# Patient Record
Sex: Female | Born: 1935
Health system: Southern US, Community
[De-identification: ages and names within clinical notes are randomized; demographics above are authoritative.]

---

## 2004-03-19 ENCOUNTER — Ambulatory Visit: Payer: Self-pay | Admitting: Unknown Physician Specialty

## 2004-05-20 ENCOUNTER — Inpatient Hospital Stay: Payer: Self-pay | Admitting: Specialist

## 2004-05-24 ENCOUNTER — Encounter: Payer: Self-pay | Admitting: Internal Medicine

## 2004-06-18 ENCOUNTER — Encounter: Payer: Self-pay | Admitting: Internal Medicine

## 2004-08-30 ENCOUNTER — Ambulatory Visit: Payer: Self-pay | Admitting: Unknown Physician Specialty

## 2004-09-06 ENCOUNTER — Ambulatory Visit: Payer: Self-pay | Admitting: Unknown Physician Specialty

## 2005-03-20 ENCOUNTER — Ambulatory Visit: Payer: Self-pay | Admitting: Unknown Physician Specialty

## 2005-03-26 ENCOUNTER — Ambulatory Visit: Payer: Self-pay | Admitting: Unknown Physician Specialty

## 2005-11-06 ENCOUNTER — Ambulatory Visit: Payer: Self-pay | Admitting: Unknown Physician Specialty

## 2006-05-21 ENCOUNTER — Ambulatory Visit: Payer: Self-pay | Admitting: Unknown Physician Specialty

## 2006-05-26 ENCOUNTER — Other Ambulatory Visit: Payer: Self-pay

## 2006-05-26 ENCOUNTER — Inpatient Hospital Stay: Payer: Self-pay | Admitting: Internal Medicine

## 2006-07-29 ENCOUNTER — Ambulatory Visit: Payer: Self-pay | Admitting: Internal Medicine

## 2006-07-31 ENCOUNTER — Ambulatory Visit: Payer: Self-pay | Admitting: Unknown Physician Specialty

## 2007-01-03 ENCOUNTER — Inpatient Hospital Stay: Payer: Self-pay | Admitting: Internal Medicine

## 2007-02-05 ENCOUNTER — Ambulatory Visit: Payer: Self-pay | Admitting: Unknown Physician Specialty

## 2008-03-31 IMAGING — CR DG ABDOMEN 2V
1 series · 2 of 2 positions shown · non-contrast
Comparison: none

REASON FOR EXAM: vomiting
COMMENTS:

[Series 1: view not recorded · 0.17mm/px · 2 of 2 slices shown]
[im 1/2]
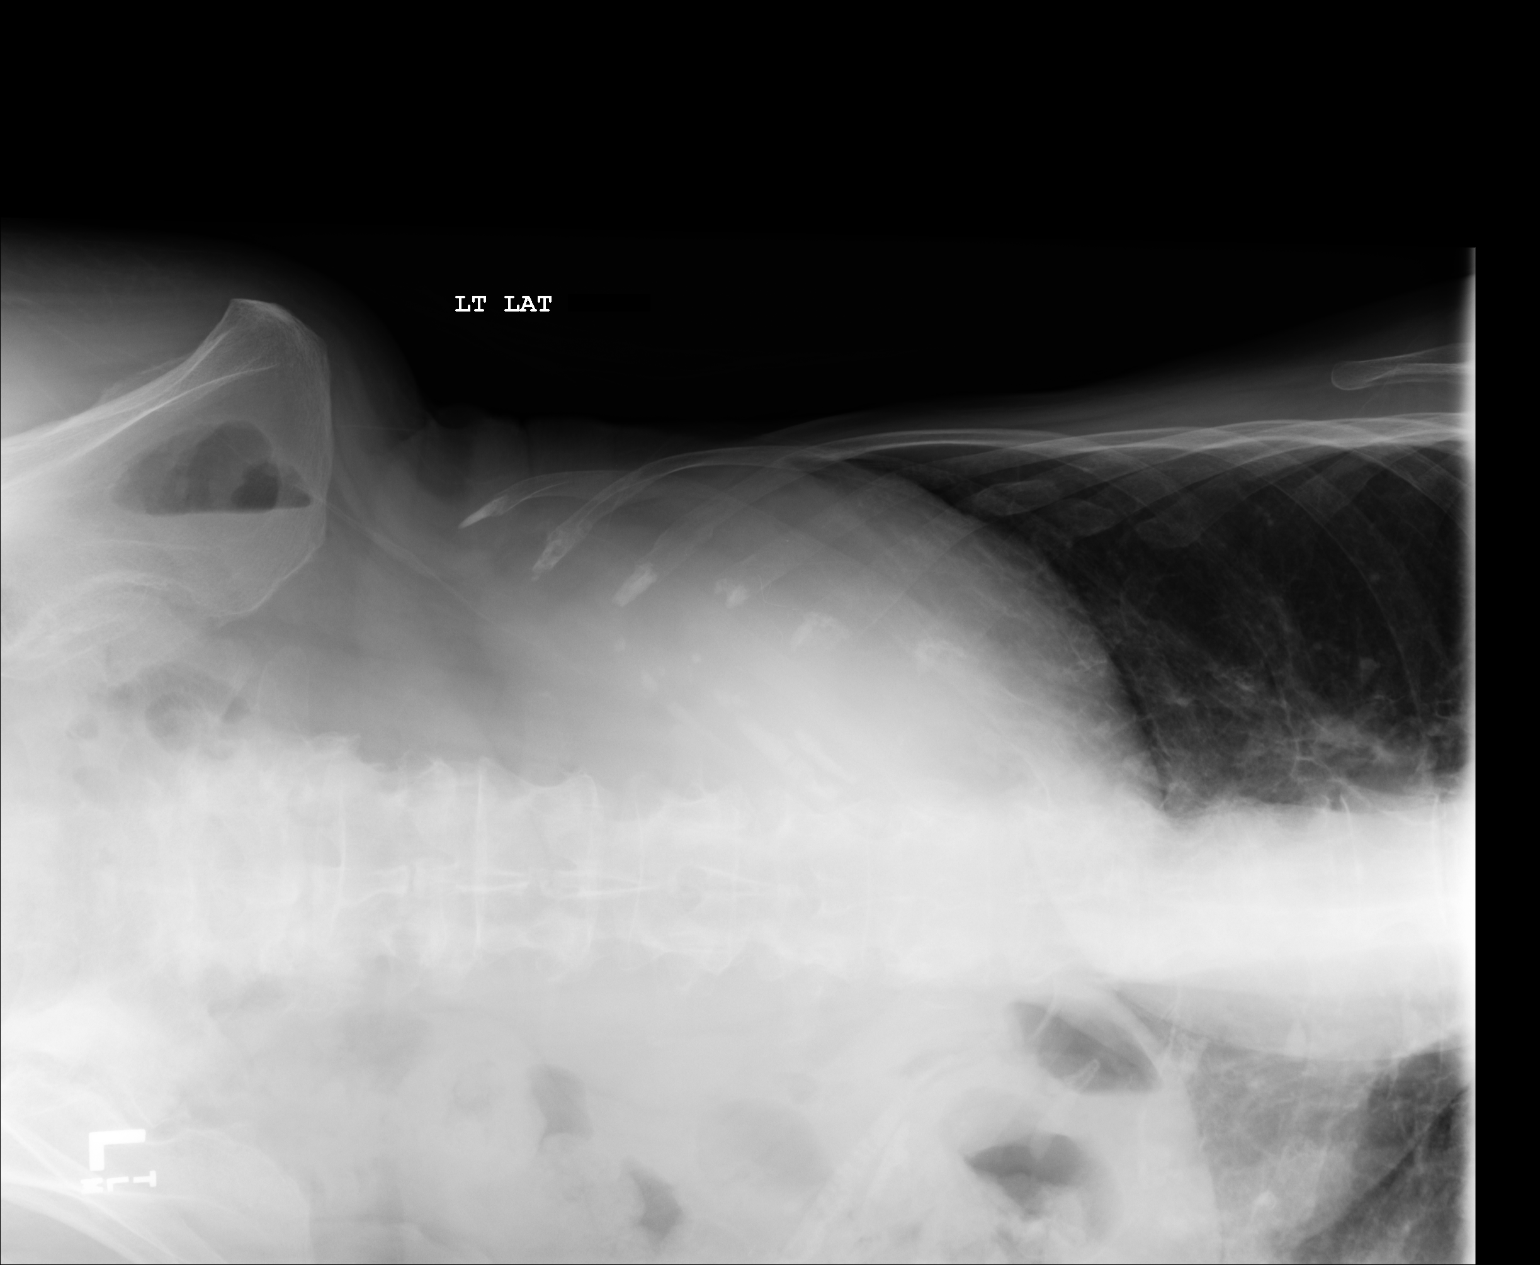
[im 2/2]
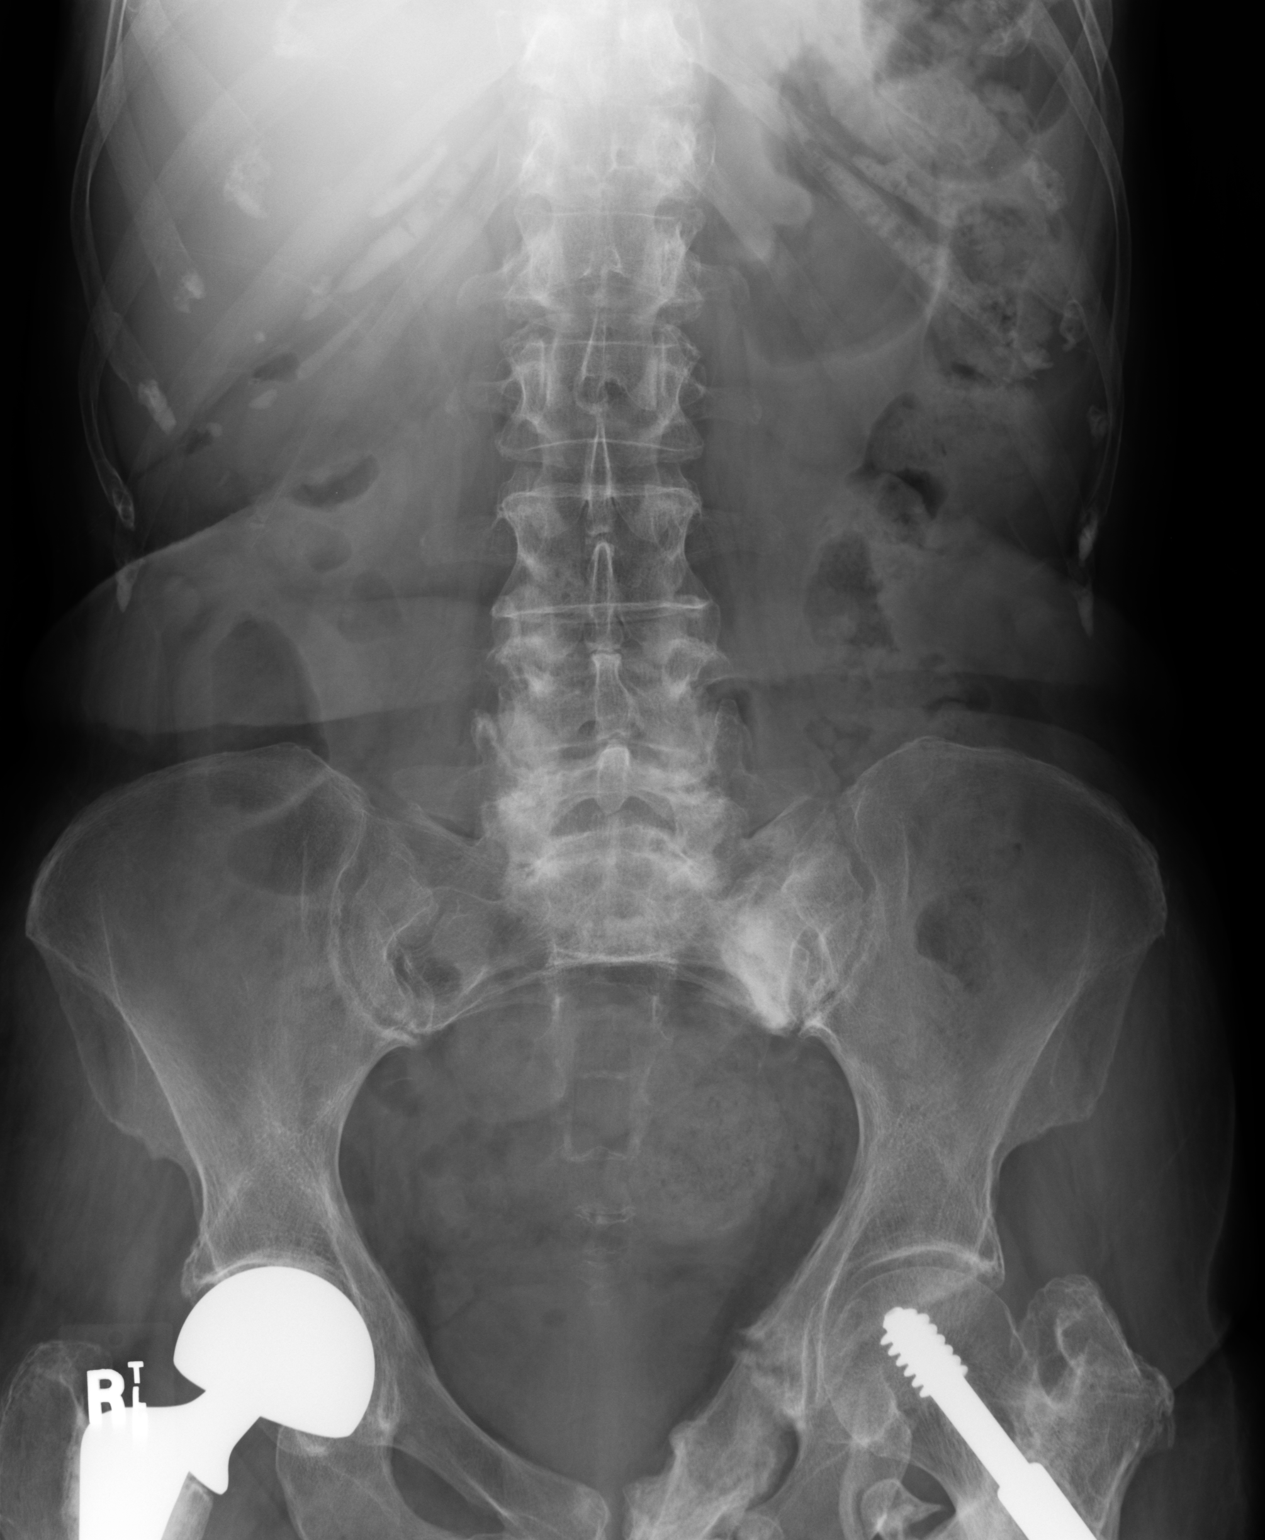

[2 of 2 positions shown; findings below may reference images not displayed]

PROCEDURE:     DXR - DXR ABDOMEN 2 V FLAT AND ERECT  - January 02, 2007 [DATE]

RESULT:     Supine and LEFT lateral decubitus images of the abdomen show
postoperative changes in both hips with sclerosis in the LEFT sacrum. There
is air and stool scattered through the colon to the sigmoid region. There is
air in the stomach. There is no abnormal bowel distention or free air.
IMPRESSION: 1.     No evidence of bowel obstruction. Incidental note is made of
sclerosis in the LEFT sacrum and healing fracture in the LEFT pubic region.
The possibility of a LEFT sacral fracture should be correlated clinically.

## 2008-06-02 ENCOUNTER — Ambulatory Visit: Payer: Self-pay | Admitting: Unknown Physician Specialty

## 2008-08-21 ENCOUNTER — Ambulatory Visit: Payer: Self-pay | Admitting: Internal Medicine

## 2008-08-23 ENCOUNTER — Ambulatory Visit: Payer: Self-pay | Admitting: Internal Medicine

## 2008-09-08 ENCOUNTER — Ambulatory Visit: Payer: Self-pay

## 2009-06-08 ENCOUNTER — Ambulatory Visit: Payer: Self-pay | Admitting: Unknown Physician Specialty

## 2009-09-01 ENCOUNTER — Inpatient Hospital Stay: Payer: Self-pay | Admitting: Internal Medicine

## 2009-10-18 ENCOUNTER — Ambulatory Visit: Payer: Self-pay | Admitting: Dermatology

## 2010-03-05 ENCOUNTER — Ambulatory Visit: Payer: Self-pay | Admitting: Unknown Physician Specialty

## 2010-03-20 ENCOUNTER — Ambulatory Visit: Payer: Self-pay | Admitting: Pain Medicine

## 2010-04-02 ENCOUNTER — Ambulatory Visit: Payer: Self-pay | Admitting: Pain Medicine

## 2010-04-12 ENCOUNTER — Ambulatory Visit: Payer: Self-pay | Admitting: Pain Medicine

## 2010-05-01 ENCOUNTER — Ambulatory Visit: Payer: Self-pay | Admitting: Pain Medicine

## 2010-07-26 ENCOUNTER — Ambulatory Visit: Payer: Self-pay | Admitting: Pain Medicine

## 2010-08-10 ENCOUNTER — Emergency Department: Payer: Self-pay | Admitting: Emergency Medicine

## 2010-08-13 ENCOUNTER — Ambulatory Visit: Payer: Self-pay | Admitting: Pain Medicine

## 2010-08-18 ENCOUNTER — Inpatient Hospital Stay: Payer: Self-pay | Admitting: Orthopedic Surgery

## 2010-08-21 DIAGNOSIS — R079 Chest pain, unspecified: Secondary | ICD-10-CM

## 2010-08-28 LAB — PATHOLOGY REPORT

## 2010-09-05 ENCOUNTER — Emergency Department: Payer: Self-pay | Admitting: *Deleted

## 2010-10-08 ENCOUNTER — Inpatient Hospital Stay: Payer: Self-pay | Admitting: Internal Medicine

## 2010-10-28 ENCOUNTER — Emergency Department: Payer: Self-pay | Admitting: Emergency Medicine

## 2010-11-18 ENCOUNTER — Emergency Department: Payer: Self-pay | Admitting: *Deleted

## 2010-12-02 ENCOUNTER — Emergency Department: Payer: Self-pay | Admitting: Emergency Medicine

## 2012-02-19 ENCOUNTER — Ambulatory Visit: Payer: Self-pay | Admitting: Internal Medicine

## 2012-02-22 ENCOUNTER — Emergency Department: Payer: Self-pay | Admitting: Internal Medicine

## 2012-03-05 ENCOUNTER — Inpatient Hospital Stay: Payer: Self-pay | Admitting: Internal Medicine

## 2012-03-05 LAB — COMPREHENSIVE METABOLIC PANEL
Albumin: 3.1 g/dL — ABNORMAL LOW (ref 3.4–5.0)
Bilirubin,Total: 0.2 mg/dL (ref 0.2–1.0)
Co2: 21 mmol/L (ref 21–32)
EGFR (African American): 14 — ABNORMAL LOW
Glucose: 101 mg/dL — ABNORMAL HIGH (ref 65–99)
Osmolality: 298 (ref 275–301)
Potassium: 5.2 mmol/L — ABNORMAL HIGH (ref 3.5–5.1)
SGOT(AST): 22 U/L (ref 15–37)
SGPT (ALT): 25 U/L (ref 12–78)
Sodium: 139 mmol/L (ref 136–145)
Total Protein: 6.3 g/dL — ABNORMAL LOW (ref 6.4–8.2)

## 2012-03-05 LAB — CBC
HCT: 23.8 % — ABNORMAL LOW (ref 35.0–47.0)
HGB: 7.4 g/dL — ABNORMAL LOW (ref 12.0–16.0)
MCV: 79 fL — ABNORMAL LOW (ref 80–100)
Platelet: 328 10*3/uL (ref 150–440)

## 2012-03-05 LAB — CK TOTAL AND CKMB (NOT AT ARMC)
CK, Total: 109 U/L (ref 21–215)
CK-MB: 3.6 ng/mL (ref 0.5–3.6)

## 2012-03-05 LAB — OCCULT BLOOD X 1 CARD TO LAB, STOOL: Occult Blood, Feces: NEGATIVE

## 2012-03-05 LAB — PRO B NATRIURETIC PEPTIDE: B-Type Natriuretic Peptide: 3392 pg/mL — ABNORMAL HIGH (ref 0–450)

## 2012-03-05 LAB — TROPONIN I: Troponin-I: 0.03 ng/mL

## 2012-03-05 LAB — IRON AND TIBC: Iron Bind.Cap.(Total): 378 ug/dL (ref 250–450)

## 2012-03-05 LAB — RAPID INFLUENZA A&B ANTIGENS

## 2012-03-06 LAB — BASIC METABOLIC PANEL
Anion Gap: 9 (ref 7–16)
Chloride: 108 mmol/L — ABNORMAL HIGH (ref 98–107)
Co2: 23 mmol/L (ref 21–32)
EGFR (African American): 14 — ABNORMAL LOW

## 2012-03-06 LAB — CBC WITH DIFFERENTIAL/PLATELET
Basophil #: 0.1 10*3/uL (ref 0.0–0.1)
Eosinophil #: 0.4 10*3/uL (ref 0.0–0.7)
Eosinophil %: 3.6 %
HCT: 27.5 % — ABNORMAL LOW (ref 35.0–47.0)
HGB: 9.2 g/dL — ABNORMAL LOW (ref 12.0–16.0)
Lymphocyte #: 2.2 10*3/uL (ref 1.0–3.6)
MCH: 27.1 pg (ref 26.0–34.0)
MCHC: 33.5 g/dL (ref 32.0–36.0)
Monocyte #: 1.1 x10 3/mm — ABNORMAL HIGH (ref 0.2–0.9)
Monocyte %: 9.7 %
Neutrophil #: 7.2 10*3/uL — ABNORMAL HIGH (ref 1.4–6.5)
Neutrophil %: 66 %
Platelet: 305 10*3/uL (ref 150–440)
RBC: 3.39 10*6/uL — ABNORMAL LOW (ref 3.80–5.20)
RDW: 17.1 % — ABNORMAL HIGH (ref 11.5–14.5)
WBC: 10.9 10*3/uL (ref 3.6–11.0)

## 2012-03-07 LAB — BASIC METABOLIC PANEL
Anion Gap: 8 (ref 7–16)
Calcium, Total: 8.2 mg/dL — ABNORMAL LOW (ref 8.5–10.1)
Chloride: 108 mmol/L — ABNORMAL HIGH (ref 98–107)
Co2: 24 mmol/L (ref 21–32)
Creatinine: 2.33 mg/dL — ABNORMAL HIGH (ref 0.60–1.30)
EGFR (African American): 21 — ABNORMAL LOW
Potassium: 5 mmol/L (ref 3.5–5.1)
Sodium: 140 mmol/L (ref 136–145)

## 2012-03-08 ENCOUNTER — Inpatient Hospital Stay: Payer: Self-pay | Admitting: Internal Medicine

## 2012-03-08 LAB — COMPREHENSIVE METABOLIC PANEL
Albumin: 3.2 g/dL — ABNORMAL LOW (ref 3.4–5.0)
Alkaline Phosphatase: 115 U/L (ref 50–136)
Anion Gap: 12 (ref 7–16)
Bilirubin,Total: 0.3 mg/dL (ref 0.2–1.0)
Calcium, Total: 8.4 mg/dL — ABNORMAL LOW (ref 8.5–10.1)
Chloride: 108 mmol/L — ABNORMAL HIGH (ref 98–107)
EGFR (African American): 26 — ABNORMAL LOW
EGFR (Non-African Amer.): 22 — ABNORMAL LOW
Glucose: 122 mg/dL — ABNORMAL HIGH (ref 65–99)
Osmolality: 289 (ref 275–301)
SGOT(AST): 37 U/L (ref 15–37)
SGPT (ALT): 26 U/L (ref 12–78)
Total Protein: 6.7 g/dL (ref 6.4–8.2)

## 2012-03-08 LAB — URINALYSIS, COMPLETE
Bilirubin,UR: NEGATIVE
Glucose,UR: NEGATIVE mg/dL (ref 0–75)
Leukocyte Esterase: NEGATIVE
Nitrite: NEGATIVE
Ph: 5 (ref 4.5–8.0)
Protein: 100
RBC,UR: 5 /HPF (ref 0–5)
Specific Gravity: 1.018 (ref 1.003–1.030)
Squamous Epithelial: 11
WBC UR: 6 /HPF (ref 0–5)

## 2012-03-08 LAB — CBC
HCT: 32.7 % — ABNORMAL LOW (ref 35.0–47.0)
HGB: 10.2 g/dL — ABNORMAL LOW (ref 12.0–16.0)
MCH: 25.5 pg — ABNORMAL LOW (ref 26.0–34.0)
MCHC: 31.2 g/dL — ABNORMAL LOW (ref 32.0–36.0)
Platelet: 335 10*3/uL (ref 150–440)
RBC: 4 10*6/uL (ref 3.80–5.20)
WBC: 25.3 10*3/uL — ABNORMAL HIGH (ref 3.6–11.0)

## 2012-03-08 LAB — PROTIME-INR
INR: 1.1
Prothrombin Time: 14.1 secs (ref 11.5–14.7)

## 2012-03-08 LAB — APTT: Activated PTT: 31.9 secs (ref 23.6–35.9)

## 2012-03-09 LAB — BASIC METABOLIC PANEL
Anion Gap: 8 (ref 7–16)
Calcium, Total: 7.8 mg/dL — ABNORMAL LOW (ref 8.5–10.1)
Co2: 22 mmol/L (ref 21–32)
Glucose: 139 mg/dL — ABNORMAL HIGH (ref 65–99)
Sodium: 140 mmol/L (ref 136–145)

## 2012-03-09 LAB — CBC WITH DIFFERENTIAL/PLATELET
Basophil #: 0.1 10*3/uL (ref 0.0–0.1)
Basophil %: 0.2 %
Eosinophil %: 0.1 %
HGB: 6.3 g/dL — ABNORMAL LOW (ref 12.0–16.0)
Lymphocyte #: 1.9 10*3/uL (ref 1.0–3.6)
Lymphocyte %: 6.9 %
MCH: 26.6 pg (ref 26.0–34.0)
MCHC: 32.3 g/dL (ref 32.0–36.0)
MCV: 82 fL (ref 80–100)
Monocyte #: 3.2 x10 3/mm — ABNORMAL HIGH (ref 0.2–0.9)
Monocyte %: 11.2 %
Neutrophil #: 23 10*3/uL — ABNORMAL HIGH (ref 1.4–6.5)
Platelet: 313 10*3/uL (ref 150–440)

## 2012-03-10 LAB — BASIC METABOLIC PANEL
Anion Gap: 8 (ref 7–16)
BUN: 44 mg/dL — ABNORMAL HIGH (ref 7–18)
Calcium, Total: 7.5 mg/dL — ABNORMAL LOW (ref 8.5–10.1)
Chloride: 112 mmol/L — ABNORMAL HIGH (ref 98–107)
EGFR (Non-African Amer.): 16 — ABNORMAL LOW
Glucose: 131 mg/dL — ABNORMAL HIGH (ref 65–99)

## 2012-03-10 LAB — CULTURE, BLOOD (SINGLE)

## 2012-03-10 LAB — CLOSTRIDIUM DIFFICILE BY PCR

## 2012-03-10 LAB — HEMOGLOBIN: HGB: 6.6 g/dL — ABNORMAL LOW (ref 12.0–16.0)

## 2012-03-10 LAB — OCCULT BLOOD X 1 CARD TO LAB, STOOL: Occult Blood, Feces: POSITIVE

## 2012-03-11 LAB — BASIC METABOLIC PANEL
Anion Gap: 10 (ref 7–16)
BUN: 51 mg/dL — ABNORMAL HIGH (ref 7–18)
Calcium, Total: 7.4 mg/dL — ABNORMAL LOW (ref 8.5–10.1)
Chloride: 113 mmol/L — ABNORMAL HIGH (ref 98–107)
Creatinine: 2.44 mg/dL — ABNORMAL HIGH (ref 0.60–1.30)
EGFR (African American): 20 — ABNORMAL LOW
EGFR (Non-African Amer.): 17 — ABNORMAL LOW
Sodium: 146 mmol/L — ABNORMAL HIGH (ref 136–145)

## 2012-03-11 LAB — CBC WITH DIFFERENTIAL/PLATELET
HCT: 23.6 % — ABNORMAL LOW (ref 35.0–47.0)
HGB: 7.7 g/dL — ABNORMAL LOW (ref 12.0–16.0)
MCH: 27.9 pg (ref 26.0–34.0)
RDW: 17 % — ABNORMAL HIGH (ref 11.5–14.5)
Segmented Neutrophils: 78 %
WBC: 24.4 10*3/uL — ABNORMAL HIGH (ref 3.6–11.0)

## 2012-03-12 LAB — CBC WITH DIFFERENTIAL/PLATELET
Basophil %: 0.5 %
Eosinophil #: 0.1 10*3/uL (ref 0.0–0.7)
Eosinophil %: 0.5 %
HGB: 6.3 g/dL — ABNORMAL LOW (ref 12.0–16.0)
Lymphocyte %: 11.8 %
MCV: 86 fL (ref 80–100)
Monocyte %: 11.4 %
Neutrophil #: 13.3 10*3/uL — ABNORMAL HIGH (ref 1.4–6.5)
Platelet: 225 10*3/uL (ref 150–440)
RBC: 2.6 10*6/uL — ABNORMAL LOW (ref 3.80–5.20)
WBC: 17.6 10*3/uL — ABNORMAL HIGH (ref 3.6–11.0)

## 2012-03-12 LAB — BASIC METABOLIC PANEL
Anion Gap: 9 (ref 7–16)
BUN: 56 mg/dL — ABNORMAL HIGH (ref 7–18)
Calcium, Total: 7.6 mg/dL — ABNORMAL LOW (ref 8.5–10.1)
Chloride: 115 mmol/L — ABNORMAL HIGH (ref 98–107)
Co2: 25 mmol/L (ref 21–32)
Creatinine: 2.32 mg/dL — ABNORMAL HIGH (ref 0.60–1.30)
EGFR (Non-African Amer.): 18 — ABNORMAL LOW
Sodium: 149 mmol/L — ABNORMAL HIGH (ref 136–145)

## 2012-03-13 LAB — CBC WITH DIFFERENTIAL/PLATELET
Basophil #: 0.1 10*3/uL (ref 0.0–0.1)
Basophil %: 0.6 %
Basophil %: 0.3 %
Eosinophil #: 0.3 10*3/uL (ref 0.0–0.7)
Eosinophil #: 0.3 10*3/uL (ref 0.0–0.7)
Eosinophil %: 1.8 %
HCT: 38 % (ref 35.0–47.0)
HGB: 12.2 g/dL (ref 12.0–16.0)
HGB: 12.4 g/dL (ref 12.0–16.0)
Lymphocyte #: 2.2 10*3/uL (ref 1.0–3.6)
Lymphocyte #: 2.7 10*3/uL (ref 1.0–3.6)
Lymphocyte %: 13.3 %
Lymphocyte %: 16.3 %
MCH: 29.6 pg (ref 26.0–34.0)
MCH: 28.4 pg (ref 26.0–34.0)
MCHC: 32.7 g/dL (ref 32.0–36.0)
Monocyte %: 7.9 %
Monocyte %: 7.5 %
Neutrophil #: 12.6 10*3/uL — ABNORMAL HIGH (ref 1.4–6.5)
Neutrophil %: 74.1 %
RBC: 4.13 10*6/uL (ref 3.80–5.20)
RBC: 4.37 10*6/uL (ref 3.80–5.20)
RDW: 16.4 % — ABNORMAL HIGH (ref 11.5–14.5)
RDW: 16.4 % — ABNORMAL HIGH (ref 11.5–14.5)

## 2012-03-13 LAB — BASIC METABOLIC PANEL
Anion Gap: 9 (ref 7–16)
Calcium, Total: 8.3 mg/dL — ABNORMAL LOW (ref 8.5–10.1)
Chloride: 114 mmol/L — ABNORMAL HIGH (ref 98–107)
EGFR (Non-African Amer.): 19 — ABNORMAL LOW
Osmolality: 317 (ref 275–301)
Sodium: 151 mmol/L — ABNORMAL HIGH (ref 136–145)

## 2012-03-14 LAB — BASIC METABOLIC PANEL
BUN: 47 mg/dL — ABNORMAL HIGH (ref 7–18)
Calcium, Total: 7.8 mg/dL — ABNORMAL LOW (ref 8.5–10.1)
Chloride: 118 mmol/L — ABNORMAL HIGH (ref 98–107)
Co2: 28 mmol/L (ref 21–32)
Creatinine: 1.87 mg/dL — ABNORMAL HIGH (ref 0.60–1.30)
EGFR (African American): 28 — ABNORMAL LOW
EGFR (Non-African Amer.): 24 — ABNORMAL LOW

## 2012-03-14 LAB — CBC WITH DIFFERENTIAL/PLATELET
Basophil #: 0.1 10*3/uL (ref 0.0–0.1)
Eosinophil #: 0.4 10*3/uL (ref 0.0–0.7)
Eosinophil %: 2.5 %
HCT: 34.6 % — ABNORMAL LOW (ref 35.0–47.0)
HGB: 11.4 g/dL — ABNORMAL LOW (ref 12.0–16.0)
Lymphocyte #: 2.1 10*3/uL (ref 1.0–3.6)
Lymphocyte %: 12 %
MCH: 29.2 pg (ref 26.0–34.0)
Monocyte #: 1.6 x10 3/mm — ABNORMAL HIGH (ref 0.2–0.9)
Monocyte %: 9.6 %
Neutrophil #: 12.9 10*3/uL — ABNORMAL HIGH (ref 1.4–6.5)
Neutrophil %: 75.3 %
Platelet: 254 10*3/uL (ref 150–440)
RBC: 3.92 10*6/uL (ref 3.80–5.20)
RDW: 16.7 % — ABNORMAL HIGH (ref 11.5–14.5)

## 2012-03-15 LAB — BASIC METABOLIC PANEL
Anion Gap: 8 (ref 7–16)
Calcium, Total: 8 mg/dL — ABNORMAL LOW (ref 8.5–10.1)
Chloride: 119 mmol/L — ABNORMAL HIGH (ref 98–107)
Co2: 26 mmol/L (ref 21–32)
Creatinine: 1.59 mg/dL — ABNORMAL HIGH (ref 0.60–1.30)
EGFR (African American): 33 — ABNORMAL LOW
Glucose: 111 mg/dL — ABNORMAL HIGH (ref 65–99)
Potassium: 3.9 mmol/L (ref 3.5–5.1)
Sodium: 153 mmol/L — ABNORMAL HIGH (ref 136–145)

## 2012-03-21 ENCOUNTER — Ambulatory Visit: Payer: Self-pay | Admitting: Internal Medicine

## 2012-06-18 DEATH — deceased

## 2014-06-10 NOTE — Consult Note (Signed)
Consult dictated, LVEF 50%, severe MR, but asymptomatic, and no EKG changes. Advise metoprolol 50 bid, and proceed with surgery.  Electronic Signatures: Radene KneeKhan, Carloyn Lahue Ali (MD)  (Signed on 19-Jan-14 10:19)  Authored  Last Updated: 19-Jan-14 10:19 by Radene KneeKhan, Tayron Hunnell Ali (MD)

## 2014-06-10 NOTE — Consult Note (Signed)
Brief Consult Note: Diagnosis: Right Femur Fracture, ARF, Pneumonia, Dementia.   Patient was seen by consultant.   Consult note dictated.   Recommend further assessment or treatment.   Orders entered.   Discussed with Attending MD.   Comments: 81 yo female with recent discharge for PNA, ARF, Anemia, readmitted for accidental fall with complete fx of right femur, will need medical management and pre op eval  1) Right Femur Fx - complete fx, pain management per surgery - plan for surgical repair later this morning  2) Preop Cardiac  - Elevated BNP- Echo - EF- 50 % with severe MR- systolic heart failure,  Xray shows congestion.    No lasix due to Renal failure.    Further evaluation by cardiology given severe MR, systolic heart failure, hx of CAD in 2000  3) pneumonia: resolving: cont with Rocephin+ Zithromax    Influenza- negative.    Blood cx negative.  4) ARF- Now improving.   Will give gentle hydration- as have CHF   5) Anemia- Guiac negative (per records from recent admission)   S/P transfusion- improved.  6) Htn- stable without meds.  7) Swallowing diff- SLP eval-Mech Soft diet, however NPO for surgery at this time.  8) Dementia- continue quetiapine, pramipexole and alprazolam. (resume PO meds after surgery)  PMD - Dr. Francia Greavesheryl Jeffries Time spent evaluating patient = 50 minutes Job# [id number].  Electronic Signatures: Stephanie AcreMungal, Anali Cabanilla (MD)  (Signed 19-Jan-14 05:27)  Authored: Brief Consult Note   Last Updated: 19-Jan-14 05:27 by Stephanie AcreMungal, Saajan Willmon (MD)

## 2014-06-10 NOTE — Op Note (Signed)
PATIENT NAME:  Deanna Vargas, Deanna B MR#:  [id number] DATE OF BIRTH:  Apr 28, 1922  DATE OF PROCEDURE:  03/08/2012  PREOPERATIVE DIAGNOSIS: Right hip periprosthetic femur fracture.   POSTOPERATIVE DIAGNOSIS: Right hip periprosthetic femur fracture.   PROCEDURE: Open reduction and internal fixation of right periprosthetic proximal femur fracture.   SURGEON: Juanell FairlyKevin Reis Goga, MD  ANESTHESIA: Spinal.   COMPLICATIONS: None.   SPECIMENS: None.   ESTIMATED BLOOD LOSS: 300 mL.   IMPLANTS:  Synthes femoral locking plate.  INDICATIONS FOR PROCEDURE: The patient is an 81 year old female from [facility] who fell. She was brought to the [facility] where she was diagnosed with a right periprosthetic proximal femur fracture just below a hemiarthroplasty prosthesis of the right hip. By x-ray evaluation, the prosthesis was stable. I had recommended to proceed with an open reduction and internal fixation of the fracture. I discussed the risks and benefits of the procedure with the patient and her family. The patient has mild to moderate dementia and therefore one of her family members, who has power of attorney, signed her consent form. They understand the risks include infection, bleeding requiring blood transfusion, nerve or blood vessel injury leading to permanent numbness or weakness, failure of the hardware, fracture, hip dislocation, leg length discrepancy, lower extremity rotation, persistent right hip pain, failure to return to ambulation and the need for further surgery. Medical risks include DVT and pulmonary embolism, myocardial infarction, stroke, pneumonia, respiratory failure and death. The family agreed to proceed with surgery.   DESCRIPTION OF PROCEDURE: The patient was marked with the word yes over the right thigh within the operative field. History and physical was completed. The patient was then brought to the operating room where she underwent a spinal  anesthetic, by the anesthesia service. The patient was then laid supine, on the operative table. All bony prominences were adequately padded. The patient was then prepped and draped in a sterile fashion. A timeout was performed to verify the patient's name, date of birth, medical record number, correct site of surgery and correct procedure to be performed. It was also used to confirm the patient had received antibiotics and that all appropriate instruments, implants and radiographic studies were available in the room. Once all in attendance were in agreement, the case began.   Initial C-arm images were taken in the AP and lateral plane. This helped to plan for the surgical incision. A lateral incision along the femur was made in line with the femur. The soft tissues were carefully dissected using electrocautery. The fascia lata was then identified and sharply incised as well with a #10 blade. The vastus lateralis was then lifted off the intramuscular septum to reveal the underlying femur. The fracture was easily palpated. The fracture was manually reduced and held into place with 2 "Malaw[place]urkey claw" fracture reduction clamps. AP and lateral images were performed of the fracture to ensure adequate reduction. Then a Synthes long periprosthetic locking plate was laid along the lateral femur and then clamped into position with fracture reduction clamps. Again fracture reduction and plate position were confirmed on AP and lateral projections. Once adequate plate position and fracture reduction had been achieved, the distal screws were placed to affix the plate to the femur. The initial screw was a bicortical to allow for close approximation of the plate to the femur. Following that the patient had a combination of locking and nonlocking screws distally for a total of 5 screws below the fracture. Proximal fixation was then achieved using  cables. A total of 5 cables were used. These were passed with a curved cable passer.  Care was taken to stay close to the cortical bone during passage of the hook to avoid injury to any neurovascular structures. These were then tensioned accordingly to provide excellent fixation to the proximal femur. The butterfly fragment was also reduced and held into position with the plate and cables as well. The plate had been prebent to allow for some proximal femoral contour with the plate at the level of the superior most aspect of the lesser trochanter. Once the wires were tensioned and positioned they were cut. The final images of the construct were performed, in the AP and lateral plane. The wound was then copiously irrigated. The fascia lata was approximated but was very thin in places and the entire fascia lata was not able to be closed. The subcutaneous tissues were closed in 2 layers with 0 Vicryl and 2-0 Vicryl and the skin was approximated with staples, after copious irrigation. The patient was not having any significant bleeding or oozing and therefore a drain was not placed. The patient then had a dry sterile dressing applied. She was transferred to a hospital bed and brought to the PAC-U in stable condition. Postop films in the PAC-U showed that the hemiarthroplasty prosthesis remained located and the fracture was well reduced. I was scrubbed and present for the entire case and all sharp and instrument counts were correct at the conclusion     of the case. I spoke with the patient's family postoperatively to let them know the case had gone without complication and the patient was stable in the recovery room. They were soon after brought to the bedside to see the patient personally. ____________________________ Kathreen Devoid, MD klk:sb D: 03/24/2012 07:50:43 ET T: 03/24/2012 08:21:05 ET JOB#: [id number]  cc: Kathreen Devoid, MD, <Dictator> Kathreen Devoid MD ELECTRONICALLY SIGNED 04/13/2012 9:11

## 2014-06-10 NOTE — Consult Note (Signed)
Brief Consult Note: Diagnosis: Recurrent anemia and heme positive stools.   Patient was seen by consultant.   Discussed with Attending MD.   Comments: Patient with femur fracture s/p surgery as well as recent admission for CHF/ pneumonia. Patient is anemic with significant drop in H and H without any obvious blood loss such as melena or hematochezia. Blood count is better after transfusion. Heme positive stools. Colonoscopy in 2005 showed few small polyps.  Recommendations: Continue to follow H and H and transfuse if needed. PPI to prevent/ treat stress ulcerations. Patient is not in shape to handle endoscopies and endoscopies will likely not change the management at this point. Will follow.  Electronic Signatures: Lurline DelIftikhar, Daved Mcfann (MD)  (Signed 21-Jan-14 18:06)  Authored: Brief Consult Note   Last Updated: 21-Jan-14 18:06 by Lurline DelIftikhar, Mafalda Mcginniss (MD)

## 2014-06-10 NOTE — H&P (Signed)
PATIENT NAME:  Deanna Vargas, CONNAUGHTON MR#:  [id number] DATE OF BIRTH:  [birth year]  DATE OF ADMISSION:  03/05/2012  PRIMARY CARE PHYSICIAN: Francia Greaves, MD (The patient is a resident of [facility])   PRESENTING COMPLAINT: Shortness of breath, low oxygen.    HISTORY OF PRESENT ILLNESS: The patient is an 81 year old female who comes from a skilled nursing facility with complaint of worsening shortness of breath gradually over the last few days. History is obtained from her healthcare proxy, Ms. Darel Hong, phone number [phone]. The patient is demented. She is alert but not able to give any history. As per Ms. Arna Medici, for the last few days the patient has worsening of her respiratory status and she is becoming gradually more and more weak. Her usual functional status is she is wheelchair bound, nurses have to feed her, but she finishes satisfactory amount of diet. Mentally she is demented, but for the last few days she stopped eating enough and she is getting more and more weak and remaining most of the time in the bed. She is very short of breath and that is why they checked the oxygen saturation. It was 66 on room air and they decided to send her to the hospital for further evaluation. On arrival to the Emergency Room, the ER physician also noticed that her hemoglobin dropped compared to her last admission here in the hospital and her renal function has also worsened. She was also noticed having dehydration and she was hypoxic. So she is being admitted for pneumonia, dehydration, acute renal failure and anemia.    REVIEW OF SYSTEMS: Unable to obtain in detail because the patient is demented. As per Darel Hong, there was no fever reported by the nursing home.   PAST MEDICAL HISTORY:  1.      Alzheimer's dementia.  2.      Hypertension.  3.      Hyperlipidemia.  4.      Depression. 5.      Gastroesophageal reflux disease. 6.      History of Clostridium difficile colitis in the  past. 7.      Right humeral fracture. 8.      Coronary artery disease in April 2002.    SOCIAL HISTORY: No smoking. No alcohol abuse. No illicit drug use. Lives in [facility].   FAMILY HISTORY: Both mother and father died from complications of heart disease.   MEDICATIONS: (At the nursing home) 1. Lexapro 10 mg oral tablet daily. 2. Mirapex 0.5 mg tablet, pramipexole, daily. 3. Seroquel 25 mg tablet each morning. 4. Imdur 30 mg tablet daily. 5. Celebrex 200 mg capsule daily. 6. Crestor 5 mg tablet daily. 7. Claritin 10 mg p.o. daily.  8. Boniva 150 mg tablet once a month. 9. Aspirin 81 mg chewable tablet daily. 10. TUMS chewable twice daily. 11. Captopril 12.5 mg tablet twice a day. 12. Prilosec 20 mg capsule twice a day. 13. Ativan 0.5 mg tablet 3 times a day for anxiety.  14. Tylenol 500 mg q. 8 hours for pain control. 15. Remeron 50 mg oral tablet at bedtime.  16. Aricept 10 mg tablet at bedtime. 17. Sulfamethoxazole trimethoprim 1 tablet by mouth once daily at bedtime for UTI prophylaxis. 18. Xanax 0.5 mg tablet by mouth every 8 hours as needed for anxiety.  19. Benadryl 25 mg p.o. every 6 hours. 20. Tapering prednisone, finished on 03/01/2012.  21. Albuterol nebulizer every 4 hours as needed. 22. Levaquin 500 mg p.o. for  7 days, started on 7th.   PHYSICAL EXAMINATION:  VITAL SIGNS: Temperature is 97.6, pulse rate 72, respirations 22, blood pressure 128/59 and oxygen saturation 85% on room air and 100% with oxygen 3 to 4 liters nasal cannula supplementation.   GENERAL: The patient is alert but not oriented to time, place and person. She is in mild distress due to respiratory problems, lying in bed comfortably.   HEENT: Head and neck atraumatic. Conjunctivae pale. Oral mucosa dry. She is appropriate for her age.   NECK: Supple. No JVD.   CARDIOVASCULAR: S1 and S2 present, regular. No murmur appreciated.   RESPIRATORY: Bilateral equal air entry, few  crepitations present.   ABDOMEN: Soft, nontender. Bowel sounds present. No organomegaly.   SKIN: Few ecchymosis on both arms as a result of trial of blood collection. No rashes.  EXTREMITIES: Legs no edema   NEUROLOGICAL: Power 4 out of 5 all four limbs. Hard of hearing so cannot follow commands but moves limbs. Does not appear in any acute psychiatric illness at this point of time.  LABS/RADIOLOGIC STUDIES:  BNP 3392. Glucose 101, BUN 70, creatinine 3.33, sodium 139, potassium 5.2, chloride 108, CO2 21, calcium 8.1, total protein 6.3, albumin 3.1, bilirubin 0.2, SGOT 22 and SGPT 25. Troponin 0.03. WBC 12.5, hemoglobin 7.4, platelet count 328 and MCV 79.   ABG: pH 7.28, pCO2 46 and pO2 68 with FiO2 28% nasal cannula.  Chest x-ray: Mild basilar opacity. May be secondary to atelectasis. Infection or aspiration are not excluded. Small left pleural effusion.   ASSESSMENT AND PLAN: An 81 year old female with Alzheimer's dementia, a nursing home resident with progressively worsening nutritional status who came with shortness of breath and was given a trial of tapering dose of steroids orally and oral antibiotic, Levaquin, at nursing home, but no improvement so sent to the hospital. 1. Pneumonia. As she already failed the dose of Levaquin, we will start her on Rocephin and Zithromax for pneumonia coverage. With pneumonia the patient is hypoxic and she is dehydrated with acute renal failure. So we will give gentle hydration as her BNP is also elevated. We will check the renal function, do blood culture and sputum culture, and adjust antibiotic. Continue oxygen via nasal cannula as of now.  2. Acute renal failure secondary to dehydration due to decreased oral intake and infection. We will hydrate gently and follow BMP tomorrow.  3. Acute on chronic anemia. Hemoglobin in October 2012 was 11.5, currently we have 7.4. This might also be contributing to the shortness of breath and it might help have developed  over long period of time because of decreased oral intake. ER physician already ordered blood transfusion. We will continue for now with that and we will replace iron orally later on. Stool for guaiac to check for blood.  4. Elevated BNP. As per the patient's healthcare proxy, Darel Hong, there is no history of congestive heart failure, but the patient has history of coronary artery disease in April 2012. There is no echocardiogram in our system. I will order echocardiogram to be done this admission and we will see. Currently there are no signs of heart failure noticed. On the contrary, the patient looks dehydrated and maybe BNP is high because of that. We will see the echocardiogram and follow. 5. Swallowing difficulty, decreased oral intake. As per the healthcare proxy, for the last few months the patient has decreased oral intake and gradually worsening. We will order speech and swallow evaluation for that. Currently, the patient  is on mechanical soft diet  6. Hypertension. Currently we will not give any hypertensive medication because she is with pneumonia and dehydration.  7. Dementia. We will continue quetiapine, pramipexole and alprazolam as needed. 8. Gastroesophageal reflux disease. For GI prophylaxis, we will give Nexium IV. 9. DVT prophylaxis. Because her hemoglobin has dropped and we are suspecting possible GI loss, we will give her pneumatic compression hose, no anticoagulation at this point of time.   CODE STATUS: DNR, confirmed with healthcare proxy, Darel Hongora Williams; cell phone number [phone].   The patient is critical currently with having acute renal failure, dehydration, anemia and shortness of breath with oxygen saturation dropping to 66%, as per nursing home records, and due to her old age. Condition explained to the healthcare proxy including the possibility of respiratory arrest and worsening in her kidney function and cardiac status.  TOTAL CRITICAL CARE TIME SPENT: 55  minutes.  ____________________________ Hope PigeonVaibhavkumar G. Elisabeth PigeonVachhani, MD vgv:sb D: 03/05/2012 13:18:06 ET    T: 03/05/2012 14:20:35 ET       JOB#: [id number] cc: Hope PigeonVaibhavkumar G. Elisabeth PigeonVachhani, MD, <Dictator> Altamese DillingVAIBHAVKUMAR Angelene Rome MD ELECTRONICALLY SIGNED 04/04/2012 15:45

## 2014-06-10 NOTE — Consult Note (Signed)
Chief Complaint:   Subjective/Chief Complaint Hemoglobin lower again without signs of active GI bleeding. No melena pr hematochezia. No hematemesis. Left arm is swollen and blue, ? blood.  Impression: Anemia. Etiology is not clear but there are no signs of significant active bleeding. ? bleeding in left arm.  Recommendations: Agree with PRBC transfusion. Patient will not be able to tolerate colonoscopy and an EGD will likely not change the management as there are no signs of active upper GI bleed. Continue Protonix. Will follow.   Electronic Signatures: Lurline DelIftikhar, Vilas Edgerly (MD)  (Signed 23-Jan-14 12:07)  Authored: Chief Complaint   Last Updated: 23-Jan-14 12:07 by Lurline DelIftikhar, Marcelle Bebout (MD)

## 2014-06-10 NOTE — Consult Note (Signed)
CC: anemia.  Pt denies any bleeding, no abd pain, no vomting.  i will sign off, reconsult if needed.  Electronic Signatures: Scot JunElliott, Messiah Rovira T (MD)  (Signed on 26-Jan-14 12:04)  Authored  Last Updated: 26-Jan-14 12:04 by Scot JunElliott, Kordell Jafri T (MD)

## 2014-06-10 NOTE — Consult Note (Signed)
PATIENT NAME:  Deanna Vargas, [name] MR#:  [id number] DATE OF BIRTH:  Jun 10, 1922  DATE OF CONSULTATION:  03/13/2012  CONSULTING PHYSICIAN:  Lurline DelShaukat Damichael Hofman, MD  REASON FOR CONSULTATION:  Anemia post hip surgery.   HISTORY OF PRESENT ILLNESS:  An 81 year old female who was admitted about 5 days ago. Apparently, the patient fell, could not get up, and was found to have an old left clavicular fracture, as well as fracture of the right hip. The patient underwent surgery the next day. Her hemoglobin that was around 10 on admission, slowly dropped to around 7 postoperatively, and GI was consulted. Stools were heme-positive, but the patient had no melena, hematochezia, nausea, vomiting or hematemesis. The patient was evaluated. The patient did not give much history, and much of the information was obtained from the patient's chart and the patient's daughter. Apparently, the patient has had a colonoscopy in 2005, and a few small polyps were removed. No other significant GI symptoms were reported by the family or the patient. The patient has been followed for the last few days, and her hemoglobin remained low despite blood transfusion.   PAST MEDICAL HISTORY: Significant for depression, hypercholesterolemia, history of arthritis, history of C. diff colitis, prior orthopedic surgeries.   ALLERGIES: IODINE, VICODIN AND RELAFEN.   HOME MEDICATIONS:  Include ferrous sulfate, Ceftin, Crestor, Claritin, aspirin, omeprazole,  lorazepam, ibandronate and Xanax.   SOCIAL AND FAMILY HISTORY: Not available.   REVIEW OF SYSTEMS:  Barely available.   PHYSICAL EXAMINATION: GENERAL:  Very elderly, frail-appearing female, does not appear to be in acute distress. She did appear to be somewhat anemic on initial evaluation. Lethargic, sleepy. Does not answer questions appropriately.  VITAL SIGNS:  She has been afebrile. Heart rate is in the 80s and 90s. Blood pressure has been fairly stable throughout.  LUNGS:  Grossly clear to  auscultation bilaterally with fair air entry and no added sounds.   CARDIOVASCULAR:  Regular rate and rhythm. No gallops or murmurs were heard.  ABDOMEN:  Fairly benign. Abdomen is soft, nontender, nondistended. No hepatosplenomegaly was noted.  NEUROLOGIC:  Examination is hard to assess. Some bruising and bluish discoloration of the left upper extremity was noted. Right lower extremity has some edema.   LABORATORY DATA:  White cell count is somewhat elevated at around 16.9. The patient's hemoglobin was 9.2 on January 17; the day before, it was reported to be 7.4. On admission, her hemoglobin was 10.2, which dropped down to 6.3, 7.2, 6.6 and 8.5. The patient has received blood transfusion, but just by blood transfusion as of yesterday, hemoglobin dropped to 6.32. Two more units of packed RBCs were given, and suddenly her hemoglobin has jumped up to 12.2, and repeat hemoglobin again is 12.4. Electrolytes are fairly unremarkable. BUN is 54, creatinine is 2.24, sodium is somewhat elevated at 151. INR is within normal limits at 1.1.   ASSESSMENT AND PLAN:  The patient with anemia. She was recently discharged from the hospital, fell, broke her hip, came back to the hospital. Her hemoglobin was higher than her baseline at around 10, which then dropped to 7.5, and eventually to 6.3. Some of those readings appear to be probably incorrect, as her hemoglobin now is 12, which is as anticipated after 5 units of blood transfusion in somebody with a hemoglobin of 7. There are no signs of active GI blood loss. There is no hematemesis, melena or hematochezia. Heme-positive stool in an acutely sick patient does not signify significant GI blood loss. The patient  remains hemodynamically stable throughout the hospital stay. Doubt any acute GI blood loss. The anemia is chronic, and probably was further exacerbated by the fracture and fall, as well as surgery. Her hemoglobin is now normal. Dr. Marva Panda will re-evaluate the  patient tomorrow, and if hemoglobin and hematocrit  remain normal, we will sign off and follow her as outpatient.     ____________________________ Lurline Del, MD si:dm D: 03/13/2012 15:31:00 ET T: 03/13/2012 20:59:23 ET JOB#: [id number]  cc: Lurline Del, MD, <Dictator> Lurline Del MD ELECTRONICALLY SIGNED 03/16/2012 17:01

## 2014-06-10 NOTE — Consult Note (Signed)
Chief Complaint:   Subjective/Chief Complaint No signs of active GI bleed. H and H somewhat lower. Repeat CBC in am. Will follow and make further recommendations.   Electronic Signatures: Lurline DelIftikhar, Devann Cribb (MD)  (Signed 22-Jan-14 17:22)  Authored: Chief Complaint   Last Updated: 22-Jan-14 17:22 by Lurline DelIftikhar, Atavia Poppe (MD)

## 2014-06-10 NOTE — Discharge Summary (Signed)
PATIENT NAME:  Deanna Vargas, ROYALS MR#:  [id number] DATE OF BIRTH:  [birth year]  DATE OF ADMISSION:  03/08/2012 DATE OF DISCHARGE:  03/15/2012  DISCHARGE DIAGNOSES:  1. Fall. 2. Fractured femur. 3. Anemia.  4. Stool occult blood positive. No gastrointestinal work-ups. 5. Hypertension. 6. Acute renal failure. 7. Recent pneumonia   HISTORY OF PRESENT ILLNESS: This 81 year old female was in the hospital and was treated for acute respiratory failure, sent to the nursing home. She fell down at the nursing home and had a fracture of the femur, difficulty to stand up or ambulate, and was sent to the hospital. In the ER, her open reduction internal fixation of the fracture was done by Dr. Martha Clan on the first day, and she was admitted under Orthopedic Surgery initially for that. The medical team was following as a Research scientist (medical).    HOSPITAL COURSE: She had a long hospital course this time because after the surgery, on the floor, she developed anemia, and she was transfused 2 units of blood because her hemoglobin was 7.0. Her hemoglobin came up, but the next day it did drop again.  Stool for occult blood was positive. GI consult was done, but GI refused to do endoscopy or colonoscopy because of her high risk status and just advised to monitor. Hemoglobin dropped again, and she was transfused 3 more units of blood transfusion. After that, her hemoglobin came up to 12, and it remained stable for 3 days. The patient remained generally comfortable and had a few episodes of some drowsiness, headaches, and some minor complaints; but overall the stay remained without major complaints other than anemia and GI blood loss.  The pain was managed with Percocet and morphine as needed in the hospital. Finally, because there was no orthopedic issue, she was transferred to medical care, and after stabilization she was discharged back to the nursing home for further rehab.   Other medical issues addressed during the hospital stay:   1. Anemia: Due to GI blood loss as mentioned above. Stool guaiac was positive. She was slowly losing blood, but GI decided not to do any work-up because of her high-risk behavior and old age and advised to monitor. Five units of PRBC transfusion was done, and after that her hemoglobin was 12. So, we are finally suspecting that the low hemoglobin was a bad mistake, and her hemoglobin was 7 to begin with; and after 5 units of transfusion, her hemoglobin came up to 12, and it remained stable for 3 days at that level.  2. History of pneumonia: She was recently treated for respiratory failure and pneumonia. She was discharged on oral antibiotics to a nursing home. While in the hospital, this complaint remained stable. We just continued her antibiotics for a few days while she was in the hospital.  3. Hypokalemia: We replaced and rechecked normal.   4. Acute renal failure: On admission last time, her creatinine was 3.3, and it came down to 2, but this time her creatinine improved and on discharge it is 1.59.   CONDITION ON DISCHARGE: Stable.   CODE STATUS ON DISCHARGE:  DO NOT RESUSCITATE.   DISCHARGE MEDICATIONS:  1. Pramipexole 0.5 mg oral tablet once a day.  2. Quetiapine 5 mg, 1-1/2 oral tablet once a day.  3. Crestor 5 mg tablet once a day.  4. Claritin 10 mg oral tablet once a month.  5. Ibandronate 150 mg oral tablet once a month 1 hour before food.  6. Omeprazole 20 mg oral delayed-release capsule  2 times a day.  7. Lorazepam 0.5 mg oral tablet 3 times a day.  8. Mirtazapine 15 mg oral tablet, 1/2 tablet orally once a day.  9. Benadryl 25 mg oral tablet 6 hours as needed for itching.  10. Albuterol 3 mL every 4 hours as needed for wheezing.  11. Ferrous sulfate 325 mg oral tablet 3 times a day.  12. Acetaminophen/oxycodone 325/5 mg every 8 hours as needed.   OXYGEN ON DISCHARGE: No.   DIET: Low sodium, diet consistency mechanical soft with pureed meat.   ACTIVITY LIMITATION: As  tolerated. Take precaution that she does not try to come off the bed on her own, especially at night, bed rails, if possible.   TIMEFRAME FOR FOLLOW UP: Check hemoglobin level in 1 to 2 weeks as she has Hemoccult blood positive, stable hemoglobin level.  GI suggested no work-up due to overall poor health. Follow up with Dr. Juanell FairlyKevin Krasinski in orthopedic surgery and with Dr. Francia Greavesheryl Jeffries, family physician in [place].   TOTAL TIME SPENT IN DISCHARGE: 45 minutes.   ____________________________ Hope PigeonVaibhavkumar G. Elisabeth PigeonVachhani, MD vgv:cb D: 03/15/2012 14:31:48 ET T: 03/15/2012 15:38:50 ET JOB#: [id number]  cc: Hope PigeonVaibhavkumar G. Elisabeth PigeonVachhani, MD, <Dictator> Cheryl L. Lin GivensJeffries, MD Kathreen DevoidKevin L. Krasinski, MD Altamese DillingVAIBHAVKUMAR Smitty Ackerley MD ELECTRONICALLY SIGNED 04/10/2012 17:51

## 2014-06-10 NOTE — Consult Note (Signed)
PATIENT NAME:  Deanna GalloSTANLEY, Shigeko B MR#:  [id number] DATE OF BIRTH:  1922/10/17  DATE OF CONSULTATION:  03/08/2012  REFERRING PHYSICIAN:   CONSULTING PHYSICIAN:  Laurier NancyShaukat A. Rashad Auld, MD  INDICATION FOR CONSULTATION: Preoperative clearance prior to right hip fracture surgery.   HISTORY OF PRESENT ILLNESS: This 81 year old white female with a past medical history of Alzheimer, hypertension, hyperlipidemia, depression, GI reflux, right femoral fracture, history of coronary artery disease since April 2002 came into the hospital after falling down and has a right hip fracture and needs surgery. She denies any chest pain, shortness of breath, PND, orthopnea or leg swelling.   PAST MEDICAL HISTORY:  As mentioned above. She had an echocardiogram which showed ejection fraction of 50%, severe mitral regurgitation.  Chest x-ray had mild congestion,   PHYSICAL EXAMINATION: GENERAL: She is alert, oriented x 3 in no acute distress right now.  Blood pressure is, however, elevated 160/90, pulse 107, respirations 14.  NECK: No JVD.  LUNGS: Good air entry. No rales.  HEART: Regular rate, rhythm tachycardic. Normal S1, S2, Grade IV/VI systolic murmur due to  mitral regurgitation.  ABDOMEN: Soft, nontender, positive bowel sounds.  EXTREMITIES: No pedal edema.   LABORATORY AND DIAGNOSTIC STUDIES: EKG shows sinus tachycardia at 107 beats per minute, nonspecific ST-T changes. No acute changes.   ASSESSMENT AND PLAN: The patient has a history of coronary artery disease, ejection fraction 50%, severe mitral regurgitation but clinically is stable and has no evidence of heart failure. Denies any chest pain or any symptoms; however, the blood pressure is elevated and the patient is tachycardic. We will start the patient on metoprolol tartrate 50 mg b.i.d. and proceed with surgery. The patient is low to moderate risk for right hip fracture surgery. Advise proceeding with surgery.     ____________________________ Laurier NancyShaukat A.  Woodard Perrell, MD sak:ct D: 03/08/2012 10:18:08 ET T: 03/08/2012 11:20:31 ET JOB#: [id number]  cc: Laurier NancyShaukat A. Kennesha Brewbaker, MD, <Dictator> Laurier NancySHAUKAT A Weda Baumgarner MD ELECTRONICALLY SIGNED 04/06/2012 9:03

## 2014-06-10 NOTE — Consult Note (Signed)
Pt asleep and not disturbed.  Her hgb is up to 11 and plt 254, VSS afebrile, renal funct improved.  Will see tomorrow and if stable will sign off.  Electronic Signatures: Scot JunElliott, Robert T (MD)  (Signed on 25-Jan-14 16:38)  Authored  Last Updated: 25-Jan-14 16:38 by Scot JunElliott, Robert T (MD)

## 2014-06-10 NOTE — Discharge Summary (Signed)
PATIENT NAME:  Deanna Vargas, Deanna Vargas MR#:  [id number] DATE OF BIRTH:  20-Jun-1922  DATE OF ADMISSION:  03/05/2012 DATE OF DISCHARGE:  03/07/2012  DISCHARGE DIAGNOSES:  Acute respiratory failure, pneumonia, chronic obstructive pulmonary disease exacerbation, chronic anemia, iron deficiency, acute on chronic renal failure, dysphagia, acute systolic heart failure, severe mitral regurgitation, hypertension, dementia.   HISTORY OF PRESENT ILLNESS:  The patient is an 81 year old female was sent from her skilled nursing facility with complaint of worsening shortness of breath and she was gradually becoming more and more weak for the last few weeks and also had complaint of decreased oral intake for the same. For the last few days she started getting short of breath and excessive weakness and on the day of presentation to Emergency Room, her oxygen saturation was 66 on room air as per the nursing home records.  On arrival to the Emergency Room also noticed her hemoglobin dropped from her past admission and she had worsening of her renal function, so she was admitted with pneumonia, hypoxia, dehydration, acute renal failure.   HOSPITAL COURSE AND STAY:   1.  For her acute respiratory failure which was secondary to pneumonia, she was started on Rocephin and Zithromax.  Influenza was negative.  Blood cultures were negative and she was also maintained on oxygen supplementation.  She responded very nicely to the treatment and within the next 2 days she had significant improvement and back to her baseline so she was discharged back to the nursing home and to finish oral antibiotic therapy for 5 days.  2.  Acute renal failure.  As we had patient's previous medical records and October 2012 her creatinine was 0.8 to 1.1 and this time she presented with 3.3.  She also had some component of congestive heart failure and that is why we could not give much IV fluid, but we gave gentle hydration and her creatinine improved up to 2.33  after 2 days and so she was discharged with instruction to follow up with nephrologist.   3.  Anemia.  Her hemoglobin in October 2012 was 11.5 and that seems like her baseline.  This time on presentation it was 7.4 with MCV of 79.  As she was presented with acute respiratory failure and severe shortness of breath, the ER physician ordered blood transfusion and after transfusion her hemoglobin came up to 9.2.  Her stool for guaiac was negative and she has to follow in clinic for iron supplementation and follow-up for anemia.  4.  Elevated BNP.  On admission her BNP was 3392.  We did not have any echocardiogram in our system, but we got one and it showed ejection fraction up to 50% but severe mitral regurgitation.  She also had acute renal failure and that is why we could not give much of the Lasix, on the contrary we had to hydrate her slowly and she luckily improved in respiratory status wise and in her kidney function also and so she was discharged to nursing home.  5.  Hypertension.  Her blood pressure remained stable without any medication over here and so she was maintained without any support.  6.  Swallowing difficulty.  Speech and swallow evaluation done and mechanical soft diet started.  7.  Hyperkalemia, possibly secondary to acute renal failure and she was given Kayexalate and responded well to that.  8.  Dementia.  We continued Quetiapine and alprazolam for this condition.    CONDITION ON DISCHARGE:  Stable.   CODE STATUS ON DISCHARGE:  DO NOT RESUCITATE.   MEDICATIONS ON DISCHARGE:  Acetaminophen and hydrocodone 325/5 mg every 8 hours as needed for pain, Crestor 5 mg oral tablet once a day, Claritin 10 mg oral tablet once a day, aspirin 81 mg once a day, omeprazole 20 mg delayed release 2 times a day, lorazepam 0.5 mg oral tablet 0.5 tablet 3 times a day for anxiety, Mirtazapine 50 mg oral tablet 0.5 tablet at bedtime, Benadryl 25 mg oral tablet every 6 hours as needed for itching, Alprazolam  0.5 mg oral tablet 0.5 tablet every 8 hours as needed for anxiety, Albuterol 3 mL inhalation every 4 hours as needed, Quetiapine 25 mg oral tablet in the morning for antipsychotics, ferrous sulfate 325 mg orally 3 times a day for 30 days, erythromycin 200 mg/mL 10 mL orally.    HOME OXYGEN:  Yes, 1 liter nasal cannula oxygen supplementation.    DIET ON DISCHARGE:  Low sodium, mechanical soft diet with pureed meat and special instruction were given to alternate between small bites and sips of food and liquid and check mouth frequently for oral residual pocketing.    ACTIVITY LIMITATION:  As tolerated.   TIMEFRAME TO FOLLOW-UP:  One to two weeks.  She was advised to follow with nephrologist for renal function and also her hemoglobin to be checked within 2 to 3 weeks.    TOTAL TIME SPENT ON THIS DISCHARGE:  45 minutes.      ____________________________ Hope PigeonVaibhavkumar G. Elisabeth PigeonVachhani, MD vgv:ea D: 03/07/2012 22:58:06 ET T: 03/08/2012 00:01:41 ET JOB#: [id number]  cc: Hope PigeonVaibhavkumar G. Elisabeth PigeonVachhani, MD, <Dictator> Mosetta PigeonHarmeet Singh, MD Outside Physician Altamese DillingVAIBHAVKUMAR Charlyne Robertshaw MD ELECTRONICALLY SIGNED 04/04/2012 15:48

## 2014-06-10 NOTE — Consult Note (Signed)
PATIENT NAME:  Deanna Vargas B DATE OF BIRTH:  1922/03/12  DATE OF CONSULTATION:  03/08/2012  REFERRING PHYSICIAN:     Chiquita Loth, MD CONSULTING PHYSICIAN:  Stephanie Acre, MD PRIMARY CARE PHYSICIAN:  Francia Greaves, MD SURGEON:  Juanell Fairly, MD ADMITTING PHYSICIAN:  Stephanie Acre, MD  CHIEF COMPLAINT: Fallen down.   HISTORY OF PRESENT ILLNESS: This is an 81 year old female with severe Alzheimer's dementia from a skilled nursing facility, admitted to the hospital after being found down. Subsequent x-rays showed complete fracture of the right femur. Also note the patient was recently admitted for shortness of breath and found to have acute renal failure and bacterial pneumonia for which she was being treated.  She was also noted to have anemia and she got a unit of packed red blood cell transfusion.  Currently, her BUN and creatinine is improving and her shortness of breath is also improving with decreased work of breathing and decreased O2 requirement.  Hospitalist services were consulted due to the patient having a history of severe mitral regurg, systolic heart failure, coronary artery disease back in 2000 and preop clearance and medical management of her underlying medical diseases.   PAST MEDICAL HISTORY:  Alzheimer's dementia, hypertension, hyperlipidemia, depression, history of falls, history of right humeral fracture, coronary artery disease in April 2002, history of C. difficile colitis in the past, depression, GERD.    RECENT DISCHARGE MEDICATIONS:  Acetaminophen and oxycodone 325/5 mg one tab every 8 hours as needed for pain, pramipexole 0.5 mg 1 tab daily for RLS, Crestor 5 mg daily, Claritin 10 mg daily, aspirin 81 mg daily, omeprazole 20 mg daily, lorazepam 0.5 mg t.i.d., mirtazapine 15 mg 1/2 tab once a day at bedtime, Benadryl 25 mg 1 tab 1 tab orally every 6 hours as needed for itching, Tums 1 tablet orally b.i.d., alprazolam 0.5 mg take 1/2 tablet every 8 hours as needed for  anxiety and nervousness, albuterol 3 mL inhaled for significant shortness of breath, quetiapine 25 mg 1.5 tablets orally in the morning, ferrous sulfate 1 tab t.i.d.  She is currently on Ceftin and azithromycin also.    REVIEW OF SYSTEMS:  Unable to obtain a detailed review of systems as the patient is demented.  Further review of systems is per the chart.  The patient does state she has some pain in her right leg.  Otherwise, she said she wants to go home.    SOCIAL HISTORY: Nonsmoking, no alcohol abuse, no illicit drugs. Lives in [facility] skilled nursing facility.  FAMILY HISTORY: Both her mother and father died from complications of heart disease.   PHYSICAL EXAMINATION: VITAL SIGNS: In the ED, temperature 98.2, blood pressure 140/62, pulse rate 103, respirations 16, pulse oximetry 90% on room air.  GENERAL: The patient is alert but not oriented to time, place or person. She is somewhat somnolent, but otherwise easily arousable.  Complains of pain in her leg.  HEENT: Head and neck atraumatic. Conjunctivae pale. Oral mucosa mildly dry. She is appropriate for age.  NECK: Supple. No JVD.  CHEST WALL:  The patient noted to have 2 bruises in the upper left hand corner of her chest wall. Apparently these are old bruises per nursing staff and per EMS.  Otherwise, no subsequent other deformities are noted.  LUNGS: Coarse upper airway sounds.  Decreased breath sounds bilaterally. Mild fine crackles noted at the bases.  CARDIOVASCULAR: S1 and S2, regular.  No murmurs appreciated.  ABDOMEN: Soft, nontender, nondistended.  Positive bowel sounds.  SKIN: Few ecchymoses on both arms as a result of previous trial of blood collection.  There is a mild irritant dermatitis noted on the left arm.  EXTREMITIES: Her right thigh is significantly larger than the left thigh secondary to fracture.  NEUROLOGICAL: 4 out of 5 strength in all 4 limbs.  The patient is hard of hearing so cannot follow commands,  but moves limbs.  Does not appear to be any acute respiratory or psychiatric illness at this point.  CURRENT LABORATORY AND DIAGNOSTIC DATA:  Glucose is at 119.  Other labs are currently pending.  X-rays of the right hip and right femur show a complete fracture at the midshaft area per my read. Previous EKG with no acute ST-T wave changes. Current EKG is pending.   ASSESSMENT AND PLAN: An 81 year old female with history of dementia, recent admission for pneumonia, acute renal failure and anemia, readmitted for accidental fall with complete fracture of the right femur.  1.  Right femur fracture, complete fracture.  Pain management per Surgery.  Plan for surgical repair later this morning.  The patient does have a history of falls.  Will probably need closer monitoring upon discharge and after possible repair of this right femur fracture.  2.  Preoperative cardiac clearance.  The patient was noted to have previously elevated BNP. Echocardiogram showed ejection fraction of 50% with severe mitral regurgitation, systolic failure. Chest x-ray still shows some mild congestion.  She is not getting Lasix due to her  recurrent renal failure. Current labs are pending.  I would recommend at this time she have further preoperative cardiac evaluation by Cardiology, given the severe mitral regurgitation, systolic heart failure, history of coronary artery disease in 2002 also.  3.  Pneumonia, currently resolving.  Will continue with Rocephin and Zithromax after surgery this morning.  Her previous influenza was negative and blood cultures have been negative also.  4.  Acute renal failure.  Currently creatinine is about 2.3.  She is getting LR by Surgery, was transitioned over to normal saline, gentle intravenous hydration, as she does have a history of CHF after surgery.  5.  Anemia, guaiac negative per records from recent admission, status post transfusion and her anemia is improving.  6.  Hypertension, stable without  medications.  It is mildly elevated secondary to pain.  She is getting pain management with morphine and Percocet at this time.  7.  Swallow.  Speech Therapy is following her.  She is cleared for mechanical soft diet; however, n.p.o. for surgery at this time.  8.  Dementia. Will continue her with quetiapine, pramipexole and alprazolam and we can resume these medications after surgery.   Time spent dictating and evaluating the patient:   50 minutes.   Thank you for the consult.  We will continue to follow.      ____________________________ Stephanie AcreVishal Angelisse Riso, MD vm:ct D: 03/08/2012 05:26:00 ET T: 03/08/2012 10:41:31 ET JOB#: [id number]  cc: Stephanie AcreVishal Teodoro Jeffreys, MD, <Dictator> Stephanie AcreVISHAL Tykisha Areola MD ELECTRONICALLY SIGNED 03/09/2012 15:44

## 2014-06-10 NOTE — H&P (Signed)
Subjective/Chief Complaint Right femur and clavicle fractures    History of Present Illness Patient is an 81 y/o female who fell at [facility] overnight.  Patient was noted to have deformity and could not stand or ambulate after the fall.  Patient is seen in her hospital room and her family is at the bedside.  Patient has no complaints currently.  Patient was just discharged from the hospital yesterday after being treated for acute respiratory failure.  An echo was performed durign that hospitalization that demonstrated an EF of 50%.   Past Med/Surgical Hx:  Clostridium Difficile:   uti:   MI:   dysphagia: required to tuck chin with swallowing... high risk of aspiration  Restless Leg Syndrome:   Hypercholesterolemia:   Depression:   Fracture, Pelvic:   arthritis:   htn:   fx femur: right  Elbow Surgery - Left:   Cataract Extraction: right and left  Hip Surgery - Right:   Hip Replacement - Left:   appendectomy:   ALLERGIES:  Iodine Strong: Other  Vicodin: Other  Relafen: Other  HOME MEDICATIONS: Medication Instructions Status  azithromycin 200 mg/5 mL oral powder for reconstitution 10 mL orally once a day x 5 days Active  ferrous sulfate 325 mg (65 mg elemental iron) oral tablet 1 tab(s) orally 3 times a day (with meals) x 30 days Active  Ceftin 250 mg/5 mL oral powder for reconstitution 5 mL orally 2 times a day x 5 days Active  acetaminophen-oxycodone 325 mg-5 mg oral tablet 1 tab(s) orally every 8 hours as needed for pain.  Active  pramipexole 0.5 mg oral tablet 1 tab(s) orally once a day for RLS. Active  quetiapine 25 mg oral tablet 1.5 tab(s) orally once a day (in the morning) for antipsychotics. Active  Crestor 5 mg oral tablet 1 tab(s) orally once a day to improve cholesterol. Active  Claritin 10 mg oral tablet 1 tab(s) orally once a day Active  ibandronate 150 mg oral tablet 1 tab(s) orally once a month on the first 1 hour before meds/food with 8oz of water. remain  upright for 1 hour after. Active  aspirin 81 mg oral tablet, chewable 1 tab(s) orally once a day for anti platelet aggregation. Active  omeprazole 20 mg oral delayed release capsule 1 cap(s) orally 2 times a day for gerd. Active  lorazepam 0.5 mg oral tablet 0.5 tab(s) orally 3 times a day fofr anxiety. Active  mirtazapine 15 mg oral tablet 0.5 tab(s) orally once a day (at bedtime) for depression/appetite. Active  Benadryl 25 mg oral tablet 1 tab(s) orally every 6 hours as needed for itching.  Active  Tums 1 tab(s) orally 2 times a day Active  alprazolam 0.5 mg oral tablet 0.5 tab(s) orally every 8 hours, As Needed- for Anxiety, Nervousness  Active  albuterol 3 milliliter(s) inhaled every 4 hours, As Needed- for Wheezing  Active   Family and Social History:   Family History Non-Contributory    Place of Living Nursing Home   Review of Systems:   Subjective/Chief Complaint Right hip/thigh pain    Tolerating Diet Patient is NPO    ROS Pt not able to provide ROS    Medications/Allergies Reviewed Medications/Allergies reviewed   Physical Exam:   GEN no acute distress, lying comfortably supine in bed in Buck's traction    HEENT PERRL, hearing intact to voice, dry oral mucosa, Oropharynx clear    RESP normal resp effort  clear BS  no use of accessory muscles  CARD regular rate  murmur present  no JVD    ABD denies tenderness  soft  normal BS  no Adominal Mass    GU foley catheter in place    LYMPH negative neck    EXTR negative cyanosis/clubbing, Right lower extremity is shortened and externally rotated.  Skin is intact.  Thigh and leg compartments are soft and compressible.  Tenderness to palpation over proximal right thigh.  Patient has intact sensation to light touch bilaterally with intact motor function and palpable pedal pulses.  Patient has intact AT/P/GS/EHL function in both LE.    SKIN normal to palpation    NEURO motor/sensory function intact    PSYCH alert    Lab Results:  Hepatic:  19-Jan-14 05:09    Bilirubin, Total 0.3   Alkaline Phosphatase 115   SGPT (ALT) 26   SGOT (AST) 37   Total Protein, Serum 6.7   Albumin, Serum  3.2  Routine BB:  19-Jan-14 05:09    ABO Group + Rh Type O Positive   Antibody Screen NEGATIVE (Result(s) reported on 08 Mar 2012 at 05:52AM.)  Routine Chem:  19-Jan-14 05:09    Result Comment APTTPT - CANCEL/SPECIMEN CLOTTED/NEEDS REDRAW   Glucose, Serum  122   Creatinine (comp)  1.99   Sodium, Serum 140   Potassium, Serum 4.8   Chloride, Serum  108   CO2, Serum  20   Calcium (Total), Serum  8.4   Osmolality (calc) 289   eGFR (African American)  26   eGFR (Non-African American)  22 (eGFR values <34m/min/1.73 m2 may be an indication of chronic kidney disease (CKD). Calculated eGFR is useful in patients with stable renal function. The eGFR calculation will not be reliable in acutely ill patients when serum creatinine is changing rapidly. It is not useful in  patients on dialysis. The eGFR calculation may not be applicable to patients at the low and high extremes of body sizes, pregnant women, and vegetarians.)   Anion Gap 12  Cardiac:  19-Jan-14 05:09    Troponin I 0.02 (0.00-0.05 0.05 ng/mL or less: NEGATIVE  Repeat testing in 3-6 hrs  if clinically indicated. >0.05 ng/mL: POTENTIAL  MYOCARDIAL INJURY. Repeat  testing in 3-6 hrs if  clinically indicated. NOTE: An increase or decrease  of 30% or more on serial  testing suggests a  clinically important change)  Routine UA:  19-Jan-14 05:56    Color (UA) Yellow   Clarity (UA) Cloudy   Glucose (UA) Negative   Bilirubin (UA) Negative   Ketones (UA) Trace   Specific Gravity (UA) 1.018   Blood (UA) 3+   pH (UA) 5.0   Protein (UA) 100 mg/dL   Nitrite (UA) Negative   Leukocyte Esterase (UA) Negative (Result(s) reported on 08 Mar 2012 at 06:14AM.)   RBC (UA) 5 /HPF   WBC (UA) 6 /HPF   Bacteria (UA) TRACE   Epithelial Cells (UA) 11 /HPF    Mucous (UA) PRESENT (Result(s) reported on 08 Mar 2012 at 06:14AM.)  Routine Coag:  19-Jan-14 05:09    Prothrombin -   INR - (INR reference interval applies to patients on anticoagulant therapy. A single INR therapeutic range for coumarins is not optimal for all indications; however, the suggested range for most indications is 2.0 - 3.0. Exceptions to the INR Reference Range may include: Prosthetic heart valves, acute myocardial infarction, prevention of myocardial infarction, and combinations of aspirin and anticoagulant. The need for a higher or lower target INR must be assessed  individually. Reference: The Pharmacology and Management of the Vitamin K  antagonists: the seventh ACCP Conference on Antithrombotic and Thrombolytic Therapy. [id number] Sept:126 (3suppl): [id number]. A HCT value >55% may artifactually increase the PT.  In one study,  the increase was an average of 25%. Reference:  "Effect on Routine and Special Coagulation Testing Values of Citrate Anticoagulant Adjustment in Patients with High HCT Values." American Journal of Clinical Pathology 2006;126:400-405.)   Activated PTT (APTT) - (A HCT value >55% may artifactually increase the APTT. In one study, the increase was an average of 19%. Reference: "Effect on Routine and Special Coagulation Testing Values of Citrate Anticoagulant Adjustment in Patients with High HCT Values." American Journal of Clinical Pathology 2006;126:400-405.)    07:36    Prothrombin 14.1   INR 1.1 (INR reference interval applies to patients on anticoagulant therapy. A single INR therapeutic range for coumarins is not optimal for all indications; however, the suggested range for most indications is 2.0 - 3.0. Exceptions to the INR Reference Range may include: Prosthetic heart valves, acute myocardial infarction, prevention of myocardial infarction, and combinations of aspirin and anticoagulant. The need for a higher or lower target INR must be  assessed individually. Reference: The Pharmacology and Management of the Vitamin K  antagonists: the seventh ACCP Conference on Antithrombotic and Thrombolytic Therapy. [id number] Sept:126 (3suppl): [id number]. A HCT value >55% may artifactually increase the PT.  In one study,  the increase was an average of 25%. Reference:  "Effect on Routine and Special Coagulation Testing Values of Citrate Anticoagulant Adjustment in Patients with High HCT Values." American Journal of Clinical Pathology 2006;126:400-405.)   Activated PTT (APTT) 31.9 (A HCT value >55% may artifactually increase the APTT. In one study, the increase was an average of 19%. Reference: "Effect on Routine and Special Coagulation Testing Values of Citrate Anticoagulant Adjustment in Patients with High HCT Values." American Journal of Clinical Pathology 2006;126:400-405.)  Routine Hem:  19-Jan-14 05:09    WBC (CBC)  25.3   RBC (CBC) 4.00   Hemoglobin (CBC)  10.2   Hematocrit (CBC)  32.7   Platelet Count (CBC) 335 (Result(s) reported on 08 Mar 2012 at 05:23AM.)   MCV 82   MCH  25.5   MCHC  31.2   RDW  17.9   Radiology Results:  XRay:    19-Jan-14 09:51, Clavicle Left   Clavicle Left   REASON FOR EXAM:    swelling, bruisiing around L clavilce, r/o Fx  COMMENTS:   Bedside (portable):Y    PROCEDURE: DXR - DXR CLAVICLE LEFT  - Mar 08 2012  9:51AM     RESULT: Comparison: 02/22/2012    Findings:  There is a comminuted, displaced fracture of the mid left clavicle. The   degree of displacement appears increased from prior.    IMPRESSION:   The degree of displacement of the left mid clavicle fracture appears   increased from prior.    Verified By: Gregor Hams, M.D., MD     Assessment/Admission Diagnosis Right periprosthetic femur fracture and pre-existing clavicle fracture    Plan I explained the injuries to the patient and her family including her power of attorney.  I used the white board in the patient's room to  diagram the injury and explain the proposed surgical treatment.  I am recommending surgery for pain control and femur stabilization that will allow the patient to be cared for by the nursing staff without severe pain and ultimately get OOB to a chair or wheelchair.  She uses a wheelchair at baseline, but according to the family does not do much ambulating normally.  We discussed that alternative treatments include doing no surgery but having the patient bedrested which would be associated with risks including aspiration pneumonia, pressure ulcers which may become infected and DVT/PE.  The risks and benefits of surgical intervention were discussed in detail with the patient and her family and they expressed understanding of the risks and benefits and agreed with plans for surgery.  The risks include, but are not limited to: infection, bleeding requiring transfusion, nerve and blood vessel injury, refracture, malunion, nonunion, hardware failure, change in lower extremity rotation, leg length discrepancy, need for more surgery.  Medical complications include, but are not limited to DVT, and PE, MI, stroke, pneumonia, congestive heart failure, respiratory failure, kidney failure and death.  Patient was seen and cleared for surgery by the hosptialist Dr. Ether Griffins and Dr. Humphrey Rolls from cardiology.  Their notes are on the chart.  Patient is NPO.  Plan for surgery later today if OR time is available.  Several emergent cases are before [place] in the OR today.  Patient may need to be delayed for surgery until tomorrow.  Patient's clavicle fracture will be treated in a sling.  The patient's family states that she has been removing the sling at [facility].  They understand the fracture will not require surgical intervention but may go on to a nonunion if she continues to move the shoulder and is noncompliant with her sling wear.   Electronic Signatures: Thornton Park (MD)  (Signed 19-Jan-14 12:39)  Authored: CHIEF COMPLAINT and  HISTORY, PAST MEDICAL/SURGIAL HISTORY, ALLERGIES, HOME MEDICATIONS, FAMILY AND SOCIAL HISTORY, REVIEW OF SYSTEMS, PHYSICAL EXAM, LABS, Radiology, ASSESSMENT AND PLAN   Last Updated: 19-Jan-14 12:39 by Thornton Park (MD)

## 2014-06-10 NOTE — Consult Note (Signed)
Chief Complaint:   Subjective/Chief Complaint No signs of GI bleeding. Hemoglobin is now 12 after 5 units PRBC transfusion which is appropriate as baseline hempglobin was about 7. I believe some of the other readings were incorrect.  Recommendations: Repeat CBC in am. Dr. Mechele CollinElliott will see her tomorrow.   Electronic Signatures: Lurline DelIftikhar, Harmoni Lucus (MD)  (Signed 24-Jan-14 16:31)  Authored: Chief Complaint   Last Updated: 24-Jan-14 16:31 by Lurline DelIftikhar, Tiffiney Sparrow (MD)
